# Patient Record
Sex: Female | Born: 2000 | Race: Black or African American | Hispanic: No | Marital: Single | Smoking: Never smoker
Health system: Southern US, Community
[De-identification: ages and names within clinical notes are randomized; demographics above are authoritative.]

## PROBLEM LIST (undated history)

## (undated) DIAGNOSIS — Z9889 Other specified postprocedural states: Secondary | ICD-10-CM

## (undated) HISTORY — PX: TONSILECTOMY, ADENOIDECTOMY, BILATERAL MYRINGOTOMY AND TUBES: SHX2538

---
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
## 2022-01-14 ENCOUNTER — Ambulatory Visit
Admission: EM | Admit: 2022-01-14 | Discharge: 2022-01-14 | Disposition: A | Discharge status: Home / Self Care | Payer: BC Managed Care – PPO | Attending: Physician Assistant | Admitting: Physician Assistant

## 2022-01-14 DIAGNOSIS — L089 Local infection of the skin and subcutaneous tissue, unspecified: Secondary | ICD-10-CM | POA: Diagnosis not present

## 2022-01-14 DIAGNOSIS — M7989 Other specified soft tissue disorders: Secondary | ICD-10-CM

## 2022-01-14 MED ORDER — NAPROXEN 500 MG PO TABS: 500.0000 mg | ORAL_TABLET | Freq: Two times a day (BID) | ORAL | 0 refills | Status: AC

## 2022-01-14 MED ORDER — CEPHALEXIN 500 MG PO CAPS: 500.0000 mg | ORAL_CAPSULE | Freq: Three times a day (TID) | ORAL | 0 refills | Status: AC

## 2022-01-14 NOTE — ED Triage Notes (Signed)
Pt c/o edema to right index finger first noticed yesterday. Denies any direct trauma or injury to the area.

## 2022-01-14 NOTE — ED Provider Notes (Signed)
[facility]    CSN: [id number] Arrival date & time: 01/14/22  9449      History   Chief Complaint Chief Complaint  Patient presents with   finger edema    HPI Maria Hill is a 21 y.o. female.   Patient presents today with a several day history of pain and swelling of her distal right index finger.  She denies any known injury or increase in activity prior to symptom onset.  Reports swelling has been worsening prompting evaluation.  Pain is rated 8 on a 0-10 pain scale, described as throbbing, no aggravating or alleviating factors identified.  She has not taken any over-the-counter medication for symptom management.  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  She reports that she is ambidextrous.  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  She is confident that she is not pregnant.    Past Medical History:  Diagnosis Date   Hx of tympanostomy tubes     There are no problems to display for this patient.   Past Surgical History:  Procedure Laterality Date   TONSILECTOMY, ADENOIDECTOMY, BILATERAL MYRINGOTOMY AND TUBES      OB History   No obstetric history on file.      Home Medications    Prior to Admission medications   Medication Sig Start Date End Date Taking? Authorizing Provider  cephALEXin (KEFLEX) 500 MG capsule Take 1 capsule (500 mg total) by mouth 3 (three) times daily. 01/14/22  Yes Janene Yousuf K, PA-C  naproxen (NAPROSYN) 500 MG tablet Take 1 tablet (500 mg total) by mouth 2 (two) times daily. 01/14/22  Yes Ahmaud Duthie, Noberto Retort, PA-C    Family History Family History  Problem Relation Age of Onset   Healthy Mother    Healthy Father     Social History Social History   Tobacco Use   Smoking status: Never   Smokeless tobacco: Never  Vaping Use   Vaping Use: Never used  Substance Use Topics   Alcohol use: No    Drug use: No     Allergies   Patient has no known allergies.   Review of Systems Review of Systems  Constitutional:  Positive for activity change. Negative for appetite change, fatigue and fever.  Gastrointestinal:  Negative for abdominal pain, diarrhea, nausea and vomiting.  Musculoskeletal:  Positive for arthralgias, joint swelling and myalgias.  Skin:  Positive for color change.  Neurological:  Negative for weakness and numbness.     Physical Exam Triage Vital Signs ED Triage Vitals [01/14/22 0937]  Enc Vitals Group     BP 110/70     Pulse Rate 80     Resp 18     Temp 98.6 F (37 C)     Temp Source Oral     SpO2 98 %     Weight      Height      Head Circumference      Peak Flow      Pain Score 0     Pain Loc      Pain Edu?      Excl. in GC?    No data found.  Updated Vital Signs BP 110/70 (BP Location: Left Arm)   Pulse 80   Temp 98.6 F (37 C) (Oral)   Resp 18   SpO2 98%   Visual Acuity Right Eye  Distance:   Left Eye Distance:   Bilateral Distance:    Right Eye Near:   Left Eye Near:    Bilateral Near:     Physical Exam Vitals reviewed.  Constitutional:      General: She is awake. She is not in acute distress.    Appearance: Normal appearance. She is well-developed. She is not ill-appearing.     Comments: Very pleasant female appears stated age in no acute distress sitting comfortably in exam room  HENT:     Head: Normocephalic and atraumatic.  Cardiovascular:     Rate and Rhythm: Normal rate and regular rhythm.     Heart sounds: Normal heart sounds, S1 normal and S2 normal. No murmur heard.    Comments: Capillary refill within 2 seconds bilateral hands Pulmonary:     Effort: Pulmonary effort is normal.     Breath sounds: Normal breath sounds. No wheezing, rhonchi or rales.     Comments: Clear to auscultation bilaterally Musculoskeletal:     Right hand: Swelling and tenderness present. No bony tenderness. Normal range of motion. Normal  strength. Normal sensation. There is no disruption of two-point discrimination. Normal capillary refill.     Comments: Swelling and erythema noted distal right index finger.  Tenderness palpation over affected area.  No significant swelling or purulent drainage noted at nailbed.  Normal two-point discrimination.  Hand neurovascularly intact.  Psychiatric:        Behavior: Behavior is cooperative.      UC Treatments / Results  Labs (all labs ordered are listed, but only abnormal results are displayed) Labs Reviewed - No data to display  EKG   Radiology No results found.  Procedures Procedures (including critical care time)  Medications Ordered in UC Medications - No data to display  Initial Impression / Assessment and Plan / UC Course  I have reviewed the triage vital signs and the nursing notes.  Pertinent labs & imaging results that were available during my care of the patient were reviewed by me and considered in my medical decision making (see chart for details).     Plain films were deferred as patient denies any recent trauma and has full range of motion of the hand with no significant bony tenderness.  Concern for infection given swelling and erythema.  Will cover with Keflex 500 mg 3 times daily for 5 days.  Recommended she avoid strenuous activity including repetitive motions.  She was prescribed Naprosyn for pain relief with instruction not to take NSAIDs with this medication due to risk of GI bleeding.  Discussed that if her symptoms do not improving quickly she should follow-up with a hand specialist was given contact information for local provider.  If she has any worsening symptoms including increased pain, swelling, fever, nausea, vomiting she needs to be seen immediately.  Strict return precautions given.  Final Clinical Impressions(s) / UC Diagnoses   Final diagnoses:  Swelling of right index finger  Finger infection     Discharge Instructions      Please  take cephalexin 3 times daily.  This will cover for an infection.  Take Naprosyn for pain and inflammation.  Do not take NSAIDs with this medication due to risk of GI bleeding.  This includes aspirin, ibuprofen/Advil, naproxen/Aleve.  You can use Tylenol for breakthrough pain.  Avoid strenuous activity including repetitive motion.  Keep your finger elevated.  If your symptoms or not improving quickly please follow-up with hand specialist; call to schedule appointment.  If  you have any worsening symptoms including increased pain, swelling, fever, nausea, vomiting, numbness, decreased range of motion that you need to go to the emergency room.     ED Prescriptions     Medication Sig Dispense Auth. Provider   naproxen (NAPROSYN) 500 MG tablet Take 1 tablet (500 mg total) by mouth 2 (two) times daily. 30 tablet Mianna Iezzi K, PA-C   cephALEXin (KEFLEX) 500 MG capsule Take 1 capsule (500 mg total) by mouth 3 (three) times daily. 15 capsule Toney Difatta K, PA-C      PDMP not reviewed this encounter.   Jeani Hawking, PA-C 01/14/22 1013

## 2022-01-14 NOTE — Discharge Instructions (Signed)
Please take cephalexin 3 times daily.  This will cover for an infection.  Take Naprosyn for pain and inflammation.  Do not take NSAIDs with this medication due to risk of GI bleeding.  This includes aspirin, ibuprofen/Advil, naproxen/Aleve.  You can use Tylenol for breakthrough pain.  Avoid strenuous activity including repetitive motion.  Keep your finger elevated.  If your symptoms or not improving quickly please follow-up with hand specialist; call to schedule appointment.  If you have any worsening symptoms including increased pain, swelling, fever, nausea, vomiting, numbness, decreased range of motion that you need to go to the emergency room.
